# Patient Record
Sex: Female | Born: 1970 | Hispanic: No | Smoking: Never smoker
Health system: Southern US, Community
[De-identification: ages and names within clinical notes are randomized; demographics above are authoritative.]

## PROBLEM LIST (undated history)

## (undated) DIAGNOSIS — F410 Panic disorder [episodic paroxysmal anxiety] without agoraphobia: Secondary | ICD-10-CM

## (undated) HISTORY — PX: TUBAL LIGATION: SHX77

## (undated) HISTORY — PX: TONSILLECTOMY: SUR1361

---

## 2002-03-17 ENCOUNTER — Encounter: Admission: RE | Admit: 2002-03-17 | Discharge: 2002-03-17 | Payer: Self-pay | Admitting: Otolaryngology

## 2002-03-17 ENCOUNTER — Encounter: Payer: Self-pay | Admitting: Otolaryngology

## 2003-02-26 ENCOUNTER — Emergency Department (HOSPITAL_COMMUNITY): Admission: EM | Admit: 2003-02-26 | Discharge: 2003-02-26 | Payer: Self-pay | Admitting: Emergency Medicine

## 2004-02-12 ENCOUNTER — Inpatient Hospital Stay (HOSPITAL_COMMUNITY): Admission: AD | Admit: 2004-02-12 | Discharge: 2004-02-12 | Payer: Self-pay | Admitting: *Deleted

## 2008-08-03 ENCOUNTER — Emergency Department (HOSPITAL_COMMUNITY): Admission: EM | Admit: 2008-08-03 | Discharge: 2008-08-04 | Payer: Self-pay | Admitting: Emergency Medicine

## 2011-09-06 LAB — URINE MICROSCOPIC-ADD ON

## 2011-09-06 LAB — URINALYSIS, ROUTINE W REFLEX MICROSCOPIC
Bilirubin Urine: NEGATIVE
Glucose, UA: NEGATIVE
Hgb urine dipstick: NEGATIVE
Ketones, ur: NEGATIVE
Nitrite: POSITIVE — AB
Protein, ur: NEGATIVE
Specific Gravity, Urine: 1.017
Urobilinogen, UA: 1
pH: 6.5

## 2011-09-06 LAB — PREGNANCY, URINE: Preg Test, Ur: NEGATIVE

## 2011-09-06 LAB — RAPID STREP SCREEN (MED CTR MEBANE ONLY): Streptococcus, Group A Screen (Direct): NEGATIVE

## 2013-08-29 ENCOUNTER — Encounter (HOSPITAL_COMMUNITY): Payer: Self-pay

## 2013-08-29 ENCOUNTER — Emergency Department (HOSPITAL_COMMUNITY): Payer: Self-pay

## 2013-08-29 ENCOUNTER — Emergency Department (HOSPITAL_COMMUNITY)
Admission: EM | Admit: 2013-08-29 | Discharge: 2013-08-29 | Disposition: A | Discharge status: Home / Self Care | Payer: Self-pay | Attending: Emergency Medicine | Admitting: Emergency Medicine

## 2013-08-29 DIAGNOSIS — H538 Other visual disturbances: Secondary | ICD-10-CM | POA: Insufficient documentation

## 2013-08-29 DIAGNOSIS — H579 Unspecified disorder of eye and adnexa: Secondary | ICD-10-CM | POA: Insufficient documentation

## 2013-08-29 DIAGNOSIS — J069 Acute upper respiratory infection, unspecified: Secondary | ICD-10-CM | POA: Insufficient documentation

## 2013-08-29 MED ORDER — CETIRIZINE-PSEUDOEPHEDRINE ER 5-120 MG PO TB12: 1.0000 | ORAL_TABLET | Freq: Every day | ORAL | Status: AC

## 2013-08-29 MED ORDER — ALBUTEROL SULFATE HFA 108 (90 BASE) MCG/ACT IN AERS
2.0000 | INHALATION_SPRAY | RESPIRATORY_TRACT | Status: DC | PRN
  Administered 2013-08-29: 2 via RESPIRATORY_TRACT
  Filled 2013-08-29: qty 6.7

## 2013-08-29 MED ORDER — ALBUTEROL SULFATE (5 MG/ML) 0.5% IN NEBU
5.0000 mg | INHALATION_SOLUTION | Freq: Once | RESPIRATORY_TRACT | Status: AC
  Administered 2013-08-29: 5 mg via RESPIRATORY_TRACT
  Filled 2013-08-29: qty 1

## 2013-08-29 NOTE — ED Provider Notes (Signed)
CSN: [id number]     Arrival date & time 08/29/13  1324 History  This chart was scribed for non-physician practitioner [name], PA-C working with [name], MD by Danella Maiers, ED Scribe. This patient was seen in room TR10C/TR10C and the patient's care was started at 3:10 PM.    Chief Complaint  Patient presents with  . Shortness of Breath  . Cough   Patient is a 42 y.o. female presenting with shortness of breath. The history is provided by the patient. No language interpreter was used.  Shortness of Breath Associated symptoms: cough    HPI Comments: Katherine Coffey is a 42 y.o. female who presents to the Emergency Department complaining of cough and SOB that has been worsening over the past 2 weeks. She had breathing treatments done yesterday and today with some relief. She also reports unproductive cough, itching eyes, blurred vision. Denies rhinorrhea, congestion CP abdominal pain rash. She denies h/o asthma. She took OTC cough medicine but it made the dyspnea worse. She is not on BC pills. She denies recent history of surgery. She denies history of asthma, DVT,PE, cancer. She denies laying down for prolonged periods recently or long car rides. She is a non-smoker.   History reviewed. No pertinent past medical history. Past Surgical History  Procedure Laterality Date  . Tubal ligation    . Tonsillectomy     History reviewed. No pertinent family history. History  Substance Use Topics  . Smoking status: Never Smoker   . Smokeless tobacco: Not on file  . Alcohol Use: Yes   OB History   Grav Para Term Preterm Abortions TAB SAB Ect Mult Living                 Review of Systems  Respiratory: Positive for cough and shortness of breath.   All other systems reviewed and are negative.    Allergies  Review of patient's allergies indicates no known allergies.  Home Medications   Current Outpatient Rx  Name  Route  Sig  Dispense  Refill  . dextromethorphan (DELSYM) 30  MG/5ML liquid   Oral   Take 60 mg by mouth as needed for cough.         Marland Kitchen ibuprofen (ADVIL,MOTRIN) 200 MG tablet   Oral   Take 200 mg by mouth every 6 (six) hours as needed for pain.          BP 161/105  Pulse 87  Temp(Src) 98.3 F (36.8 C) (Oral)  Resp 22  SpO2 100%  LMP 08/29/2013 Physical Exam  Nursing note and vitals reviewed. Constitutional: She is oriented to person, place, and time. She appears well-developed and well-nourished. No distress.  HENT:  Head: Normocephalic and atraumatic.  Eyes: EOM are normal.  Neck: Neck supple. No tracheal deviation present.  Cardiovascular: Normal rate.   Pulmonary/Chest: Effort normal and breath sounds normal. No respiratory distress. She has no wheezes.  Musculoskeletal: Normal range of motion.  Neurological: She is alert and oriented to person, place, and time.  Skin: Skin is warm and dry.  Psychiatric: She has a normal mood and affect. Her behavior is normal.    ED Course  Procedures (including critical care time) Medications  albuterol (PROVENTIL) (5 MG/ML) 0.5% nebulizer solution 5 mg (5 mg Nebulization Given 08/29/13 1343)   DIAGNOSTIC STUDIES: Oxygen Saturation is 100% on room air, normal by my interpretation.    COORDINATION OF CARE: 3:16 PM- Discussed treatment plan with pt which includes albuterol prescription and  pt agrees to plan.  PERC neg, timi zero.    Labs Review Labs Reviewed - No data to display Imaging Review Dg Chest 2 View  08/29/2013   CLINICAL DATA:  Short of breath. Cough  EXAM: CHEST  2 VIEW  COMPARISON:  08/04/2008  FINDINGS: The heart size and mediastinal contours are within normal limits. Both lungs are clear. The visualized skeletal structures are unremarkable.  IMPRESSION: No active cardiopulmonary disease.   Electronically Signed   By: Marlan Palau M.D.   On: 08/29/2013 14:57    MDM   1. URI (upper respiratory infection)    BP 161/105  Pulse 87  Temp(Src) 98.3 F (36.8 C) (Oral)   Resp 22  SpO2 100%  LMP 08/29/2013  I have reviewed nursing notes and vital signs. I personally reviewed the imaging tests through PACS system  I reviewed available ER/hospitalization records thought the EMR   I personally performed the services described in this documentation, which was scribed in my presence. The recorded information has been reviewed and is accurate.     [name], PA-C 08/29/13 1533

## 2013-08-29 NOTE — ED Notes (Signed)
Pt states that she feels much better than she felt when she came in.  Stated she feels the breathing treatment did some good.  Lungs sounds clear bilaterally and pt is without cough at this time.

## 2013-08-29 NOTE — ED Notes (Addendum)
Pt c/o SOB, dizziness, sensitivity to light, non productive cough x3 days. Audible wheezing noted in triage. Pt denies chest pain

## 2013-08-30 NOTE — ED Provider Notes (Signed)
Medical screening examination/treatment/procedure(s) were performed by non-physician practitioner and as supervising physician I was immediately available for consultation/collaboration.   [name], MD 08/30/13 1357

## 2018-07-15 ENCOUNTER — Encounter (HOSPITAL_COMMUNITY): Payer: Self-pay | Admitting: Emergency Medicine

## 2018-07-15 ENCOUNTER — Emergency Department (HOSPITAL_COMMUNITY)
Admission: EM | Admit: 2018-07-15 | Discharge: 2018-07-15 | Disposition: A | Discharge status: Home / Self Care | Payer: Medicaid Other | Attending: Emergency Medicine | Admitting: Emergency Medicine

## 2018-07-15 ENCOUNTER — Emergency Department (HOSPITAL_COMMUNITY): Payer: Medicaid Other

## 2018-07-15 ENCOUNTER — Other Ambulatory Visit: Payer: Self-pay

## 2018-07-15 DIAGNOSIS — R0789 Other chest pain: Secondary | ICD-10-CM

## 2018-07-15 DIAGNOSIS — Z79899 Other long term (current) drug therapy: Secondary | ICD-10-CM | POA: Insufficient documentation

## 2018-07-15 DIAGNOSIS — F419 Anxiety disorder, unspecified: Secondary | ICD-10-CM

## 2018-07-15 DIAGNOSIS — K219 Gastro-esophageal reflux disease without esophagitis: Secondary | ICD-10-CM

## 2018-07-15 HISTORY — DX: Panic disorder (episodic paroxysmal anxiety): F41.0

## 2018-07-15 LAB — BASIC METABOLIC PANEL
ANION GAP: 17 — AB (ref 5–15)
BUN: 11 mg/dL (ref 6–20)
CHLORIDE: 106 mmol/L (ref 98–111)
CO2: 17 mmol/L — ABNORMAL LOW (ref 22–32)
Calcium: 9.3 mg/dL (ref 8.9–10.3)
Creatinine, Ser: 0.67 mg/dL (ref 0.44–1.00)
GFR calc non Af Amer: >60 mL/min (ref >60)
Glucose, Bld: 103 mg/dL — ABNORMAL HIGH (ref 70–99)
POTASSIUM: 3.6 mmol/L (ref 3.5–5.1)
Sodium: 140 mmol/L (ref 135–145)

## 2018-07-15 LAB — CBC
HEMATOCRIT: 40.8 % (ref 36.0–46.0)
HEMOGLOBIN: 13.1 g/dL (ref 12.0–15.0)
MCH: 26.3 pg (ref 26.0–34.0)
MCHC: 32.1 g/dL (ref 30.0–36.0)
MCV: 81.9 fL (ref 78.0–100.0)
Platelets: 283 10*3/uL (ref 150–400)
RBC: 4.98 MIL/uL (ref 3.87–5.11)
RDW: 22.1 % — ABNORMAL HIGH (ref 11.5–15.5)
WBC: 6.8 10*3/uL (ref 4.0–10.5)

## 2018-07-15 LAB — I-STAT TROPONIN, ED
TROPONIN I, POC: 0 ng/mL (ref 0.00–0.08)
Troponin i, poc: 0 ng/mL (ref 0.00–0.08)

## 2018-07-15 LAB — I-STAT BETA HCG BLOOD, ED (MC, WL, AP ONLY): I-stat hCG, quantitative: <5 m[IU]/mL (ref <5)

## 2018-07-15 LAB — TSH: TSH: 0.55 u[IU]/mL (ref 0.350–4.500)

## 2018-07-15 MED ORDER — HYDROXYZINE HCL 25 MG PO TABS: 25.0000 mg | ORAL_TABLET | Freq: Three times a day (TID) | ORAL | 0 refills | Status: DC | PRN

## 2018-07-15 MED ORDER — ASPIRIN 81 MG PO CHEW
324.0000 mg | CHEWABLE_TABLET | Freq: Once | ORAL | Status: AC
  Administered 2018-07-15: 324 mg via ORAL
  Filled 2018-07-15: qty 4

## 2018-07-15 MED ORDER — HYDROXYZINE HCL 25 MG PO TABS: 25.0000 mg | ORAL_TABLET | Freq: Three times a day (TID) | ORAL | 0 refills | Status: AC | PRN

## 2018-07-15 MED ORDER — LORAZEPAM 2 MG/ML IJ SOLN
1.0000 mg | Freq: Once | INTRAMUSCULAR | Status: AC
  Administered 2018-07-15: 1 mg via INTRAMUSCULAR
  Filled 2018-07-15: qty 1

## 2018-07-15 MED ORDER — PANTOPRAZOLE SODIUM 20 MG PO TBEC: 20.0000 mg | DELAYED_RELEASE_TABLET | Freq: Every day | ORAL | 0 refills | Status: AC

## 2018-07-15 MED ORDER — LORAZEPAM 1 MG PO TABS
1.0000 mg | ORAL_TABLET | Freq: Once | ORAL | Status: AC
  Administered 2018-07-15: 1 mg via ORAL
  Filled 2018-07-15: qty 1

## 2018-07-15 MED ORDER — ESOMEPRAZOLE MAGNESIUM 40 MG PO CPDR: 40.0000 mg | DELAYED_RELEASE_CAPSULE | Freq: Every day | ORAL | 0 refills | Status: DC

## 2018-07-15 NOTE — Discharge Instructions (Addendum)
Continue taking home medication as prescribed. Add Protonix once a day for reflux. Use hydroxyzine as needed for anxiety or panic attacks. It is important that you follow-up with your primary care doctor for further evaluation and management of your medications. Return to the emergency room with any new, worsening, or concerning symptoms.

## 2018-07-15 NOTE — ED Provider Notes (Signed)
The patient is a 47 year old female, she presents with chest tightness, she is having difficulty speaking because she is hyperventilating however she is able to tell me that she has been having this for a week, it comes and goes at rest as well as when she is exerting herself, she has never had any chest pain before, she is not a diabetic and takes no medicine for high blood pressure, high cholesterol, she is a non-smoker and does not drink any alcohol.  On exam she has a heart rate of about 90 with no murmurs, clear lung sounds, no edema.  Her EKG is abnormal but needs to be rechecked.  There is no ST elevation to suggest a STEMI and compared to her prior EKG there are similar T wave abnormalities in the inferior leads from prior studies.  We will get some lab work, chest x-ray, repeat EKG, the patient was given 2 mg of Ativan to help with her anxiety and what is apparently a panic attack.  Medical screening examination/treatment/procedure(s) were conducted as a shared visit with non-physician practitioner(s) and myself.  I personally evaluated the patient during the encounter.  Clinical Impression:   Final diagnoses:  None         Eber HongMiller, [place], MD 07/18/18 2022

## 2018-07-15 NOTE — ED Provider Notes (Signed)
[facility] EMERGENCY DEPARTMENT Provider Note   CSN: [id number] Arrival date & time: 07/15/18  1355     History   Chief Complaint Chief Complaint  Patient presents with  . Chest Pain    HPI Katherine Coffey is a 47 y.o. female presented for evaluation of chest pain.  Patient states since she has been taking the venlafaxine, which started beginning of June, she has been having worsening anxiety and associated chest pain.  She states that when she relaxes, she starts to develop left-sided chest pain.  She becomes very anxious and occasionally short of breath.  She states that this is distant with previous episodes of panic attacks.  She has been diagnosed with anxiety and depression, tried on multiple different medications for this.  She states that this morning, she had multiple panic attacks, was never able to calm herself down.  By the time she came to the ER, she was hyperventilating.  She was given Ativan in triage, since then her symptoms have resolved.  She is currently symptom-free.  She denies current chest pain, shortness of breath, nausea, vomiting, diaphoresis, cough, leg pain or swelling.  Patient states she has a history of GERD, takes medication for this.  States GERD has been worse recently.  She denies other cardiac history. She does not smoke cigarettes.  Denies alcohol or drug use.  HPI  Past Medical History:  Diagnosis Date  . Panic attack     There are no active problems to display for this patient.   Past Surgical History:  Procedure Laterality Date  . TONSILLECTOMY    . TUBAL LIGATION       OB History   None      Home Medications    Prior to Admission medications   Medication Sig Start Date End Date Taking? Authorizing Provider  calcium carbonate (TUMS - DOSED IN MG ELEMENTAL CALCIUM) 500 MG chewable tablet Chew 1-2 tablets by mouth as needed for indigestion or heartburn.  08/15/16  Yes [provider]  Cholecalciferol  (VITAMIN D3) 2000 units capsule Take 2,000 Units by mouth daily.   Yes [provider]  Dexlansoprazole (DEXILANT) 30 MG capsule Take 30 mg by mouth daily.   Yes [provider]  ferrous sulfate 325 (65 FE) MG tablet Take 325 mg by mouth 2 (two) times daily with a meal.   Yes [provider]  levothyroxine (UNITHROID) 137 MCG tablet Take 137 mcg by mouth daily before breakfast.   Yes [provider]  naproxen sodium (ALEVE) 220 MG tablet Take 220 mg by mouth 2 (two) times daily as needed (for pain or headaches).    Yes [provider]  prazosin (MINIPRESS) 1 MG capsule Take 2 mg by mouth at bedtime.   Yes [provider]  Aspirin-Salicylamide-Caffeine (BC HEADACHE POWDER PO) Take 1 packet by mouth as needed (for pain or headaches).     [provider]  cetirizine-pseudoephedrine (ZYRTEC-D) 5-120 MG per tablet Take 1 tablet by mouth daily. Patient not taking: Reported on 07/15/2018 08/29/13   [name], PA-C  hydrOXYzine (ATARAX/VISTARIL) 25 MG tablet Take 1 tablet (25 mg total) by mouth every 8 (eight) hours as needed. 07/15/18   Dreshaun Stene, PA-C  pantoprazole (PROTONIX) 20 MG tablet Take 1 tablet (20 mg total) by mouth daily. 07/15/18   Damone Fancher, PA-C  venlafaxine (EFFEXOR) 50 MG tablet Take 50 mg by mouth 2 (two) times daily.    [provider]  Family History No family history on file.  Social History Social History   Tobacco Use  . Smoking status: Never Smoker  Substance Use Topics  . Alcohol use: Yes  . Drug use: Yes    Types: Marijuana     Allergies   Venlafaxine   Review of Systems Review of Systems  Respiratory: Positive for chest tightness (Resolved) and shortness of breath (Resolved).   Cardiovascular: Positive for chest pain (Resolved).  Psychiatric/Behavioral: The patient is nervous/anxious (Resolved).   All other systems reviewed and are negative.    Physical Exam Updated  Vital Signs BP 126/60   Pulse 61   Resp (!) 22   Ht 5' 0.5" (1.537 m)   Wt 93.9 kg   SpO2 100%   BMI 39.76 kg/m   Physical Exam  Constitutional: She is oriented to person, place, and time. She appears well-developed and well-nourished. No distress.  Laying comfortably in bed in no acute distress  HENT:  Head: Normocephalic and atraumatic.  Eyes: Pupils are equal, round, and reactive to light. Conjunctivae and EOM are normal.  Neck: Normal range of motion. Neck supple.  Cardiovascular: Normal rate, regular rhythm and intact distal pulses.  Pulmonary/Chest: Effort normal and breath sounds normal. No respiratory distress. She has no wheezes.  Speaking in full sentences.  Clear lung sounds in all fields.  No signs of respiratory distress.  Abdominal: Soft. She exhibits no distension and no mass. There is no tenderness. There is no guarding.  Musculoskeletal: Normal range of motion.  No leg pain or swelling.  Pedal pulses intact bilaterally.  Good cap refill.  Neurological: She is alert and oriented to person, place, and time.  Skin: Skin is warm and dry. Capillary refill takes less than 2 seconds.  Psychiatric: She has a normal mood and affect.  Nursing note and vitals reviewed.    ED Treatments / Results  Labs (all labs ordered are listed, but only abnormal results are displayed) Labs Reviewed  BASIC METABOLIC PANEL - Abnormal; Notable for the following components:      Result Value   CO2 17 (*)    Glucose, Bld 103 (*)    Anion gap 17 (*)    All other components within normal limits  CBC - Abnormal; Notable for the following components:   RDW 22.1 (*)    All other components within normal limits  TSH  I-STAT TROPONIN, ED  I-STAT TROPONIN, ED  I-STAT BETA HCG BLOOD, ED ([facility], [facility], [facility] ONLY)  I-STAT TROPONIN, ED    EKG EKG Interpretation  Date/Time:  Monday July 15 2018 14:44:35 EDT Ventricular Rate:  65 PR Interval:  144 QRS Duration: 88 QT Interval:  392 QTC  Calculation: 407 R Axis:   31 Text Interpretation:  Normal sinus rhythm T wave abnormality, consider inferolateral ischemia Abnormal ECG compared wtih old tracing going back to 9/14, no changes Confirmed by Eber HongMiller, Brian ([id number]) on 07/15/2018 2:45:17 PM   Radiology Dg Chest 2 View  Result Date: 07/15/2018 CLINICAL DATA:  Chest pain. EXAM: CHEST - 2 VIEW COMPARISON:  08/29/2013 FINDINGS: The heart size and mediastinal contours are within normal limits. Both lungs are clear. The visualized skeletal structures are unremarkable. IMPRESSION: No active cardiopulmonary disease. Electronically Signed   By: Francene BoyersJames  Maxwell M.D.   On: 07/15/2018 15:07    Procedures Procedures (including critical care time)  Medications Ordered in ED Medications  LORazepam (ATIVAN) injection 1 mg (1 mg Intramuscular Given 07/15/18 1413)  LORazepam (ATIVAN) tablet 1  mg (1 mg Oral Given 07/15/18 1421)  aspirin chewable tablet 324 mg (324 mg Oral Given 07/15/18 1425)     Initial Impression / Assessment and Plan / ED Course  I have reviewed the triage vital signs and the nursing notes.  Pertinent labs & imaging results that were available during my care of the patient were reviewed by me and considered in my medical decision making (see chart for details).     Pt presenting for evaluation of chest pain.  Physical exam reassuring, currently symptom-free.  She is afebrile not tachycardic.  Appears nontoxic.  Patient reports chest pain is present in conjunction with anxiety episodes.  No chest pain at other times.  Symptoms have worsened since starting the venlafaxine.  Today's episode was resolved with Ativan.  EKG reassuring, unchanged from previous.  Chest x-ray viewed interpreted by me, no pneumonia, pneumothorax, or effusions.  Initial troponin negative.  Labs reassuring.  TSH normal.  Doubt ACS, hyperthyroidism, or PE.  Likely related to anxiety.  However, will obtain delta troponin for further evaluation.  Delta  troponin negative.  Discussed with patient.  Discussed possibility of worsening GERD, symptoms are worse when she lays flat and relaxes.  Will add Protonix to patient's Dilantin.  Additionally, discussed possibility of anxiety.  Patient states she has done well on Vistaril in the past.  Will prescribe a short course of Vistaril.  Stressed importance of follow-up with PCP.  Discussed option of seeing psychology/titrate for further evaluation of her symptoms.  At this time, patient present for discharge.  Return precautions given.  Patient states she understands and agrees to plan.   Final Clinical Impressions(s) / ED Diagnoses   Final diagnoses:  Atypical chest pain  Anxiety  Gastroesophageal reflux disease, esophagitis presence not specified    ED Discharge Orders         Ordered    esomeprazole (NEXIUM) 40 MG capsule  Daily,   Status:  Discontinued     07/15/18 1748    hydrOXYzine (ATARAX/VISTARIL) 25 MG tablet  Every 8 hours PRN,   Status:  Discontinued     07/15/18 1748    hydrOXYzine (ATARAX/VISTARIL) 25 MG tablet  Every 8 hours PRN     07/15/18 1749    pantoprazole (PROTONIX) 20 MG tablet  Daily     07/15/18 1749           Alveria ApleyCaccavale, Catalia Massett, PA-C 07/15/18 2239    Loren RacerYelverton, David, MD 07/18/18 1534

## 2018-07-15 NOTE — ED Provider Notes (Addendum)
MSE was initiated and I personally evaluated the patient and placed orders (if any) at  2:22 PM on July 15, 2018.  The patient appears stable so that the remainder of the MSE may be completed by another provider.  Patient placed in Quick Look pathway, seen and evaluated   Chief Complaint: Shortness of breath, chest pain  HPI:   On initial presentation, patient nonverbal due to tremoring sensations, reporting to triage RN that she has panic attacks.  After IM Ativan, patient able to report that she has had intermittent substernal chest pain for 1 week.  Patient reports that the episodes come for approximately 30 minutes timing coming on exertion.  Patient reports that she had difficulty breathing on presentation the emergency department sensation of panic at present.  Patient denies any smoking history, cardiac history, history of hypertension.  ROS: See HPI (one)  Physical Exam:   Gen: No distress  Neuro: Awake and Alert  Skin: Warm    Focused Exam: B/l 2+ pulses and equal upper and lower extremities.  Tachycardia, but heart rhythm is regular rate and rhythm.  Lungs clear to auscultation.  Initial EKG with significant artifact, but is showing ischemic changes.  Will reattempt EKG after Ativan due to patient movement.  Tremoring resolved after Ativan. Do not suspect this was seizure. ASA administered in triage.   Initiation of care has begun. The patient has been counseled on the process, plan, and necessity for staying for the completion/evaluation, and the remainder of the medical screening examination    Delia ChimesMurray, Nikholas Geffre B, PA-C 07/15/18 1430    Delia ChimesMurray, Kariann Wecker B, PA-C 07/15/18 1432    Eber HongMiller, Brian, MD 07/18/18 2022

## 2018-07-15 NOTE — ED Triage Notes (Signed)
Patient hyperventilating in room, speaking only one or two words at a time and then hyperventilating again. Patient states she is having chest pain, a panic attack, and her face has become numb.

## 2019-04-08 IMAGING — DX DG CHEST 2V
2 series · 2 of 2 positions shown · non-contrast
Comparison: 08/29/2013

CLINICAL DATA: Chest pain.

EXAM:
CHEST - 2 VIEW

[chest pa]
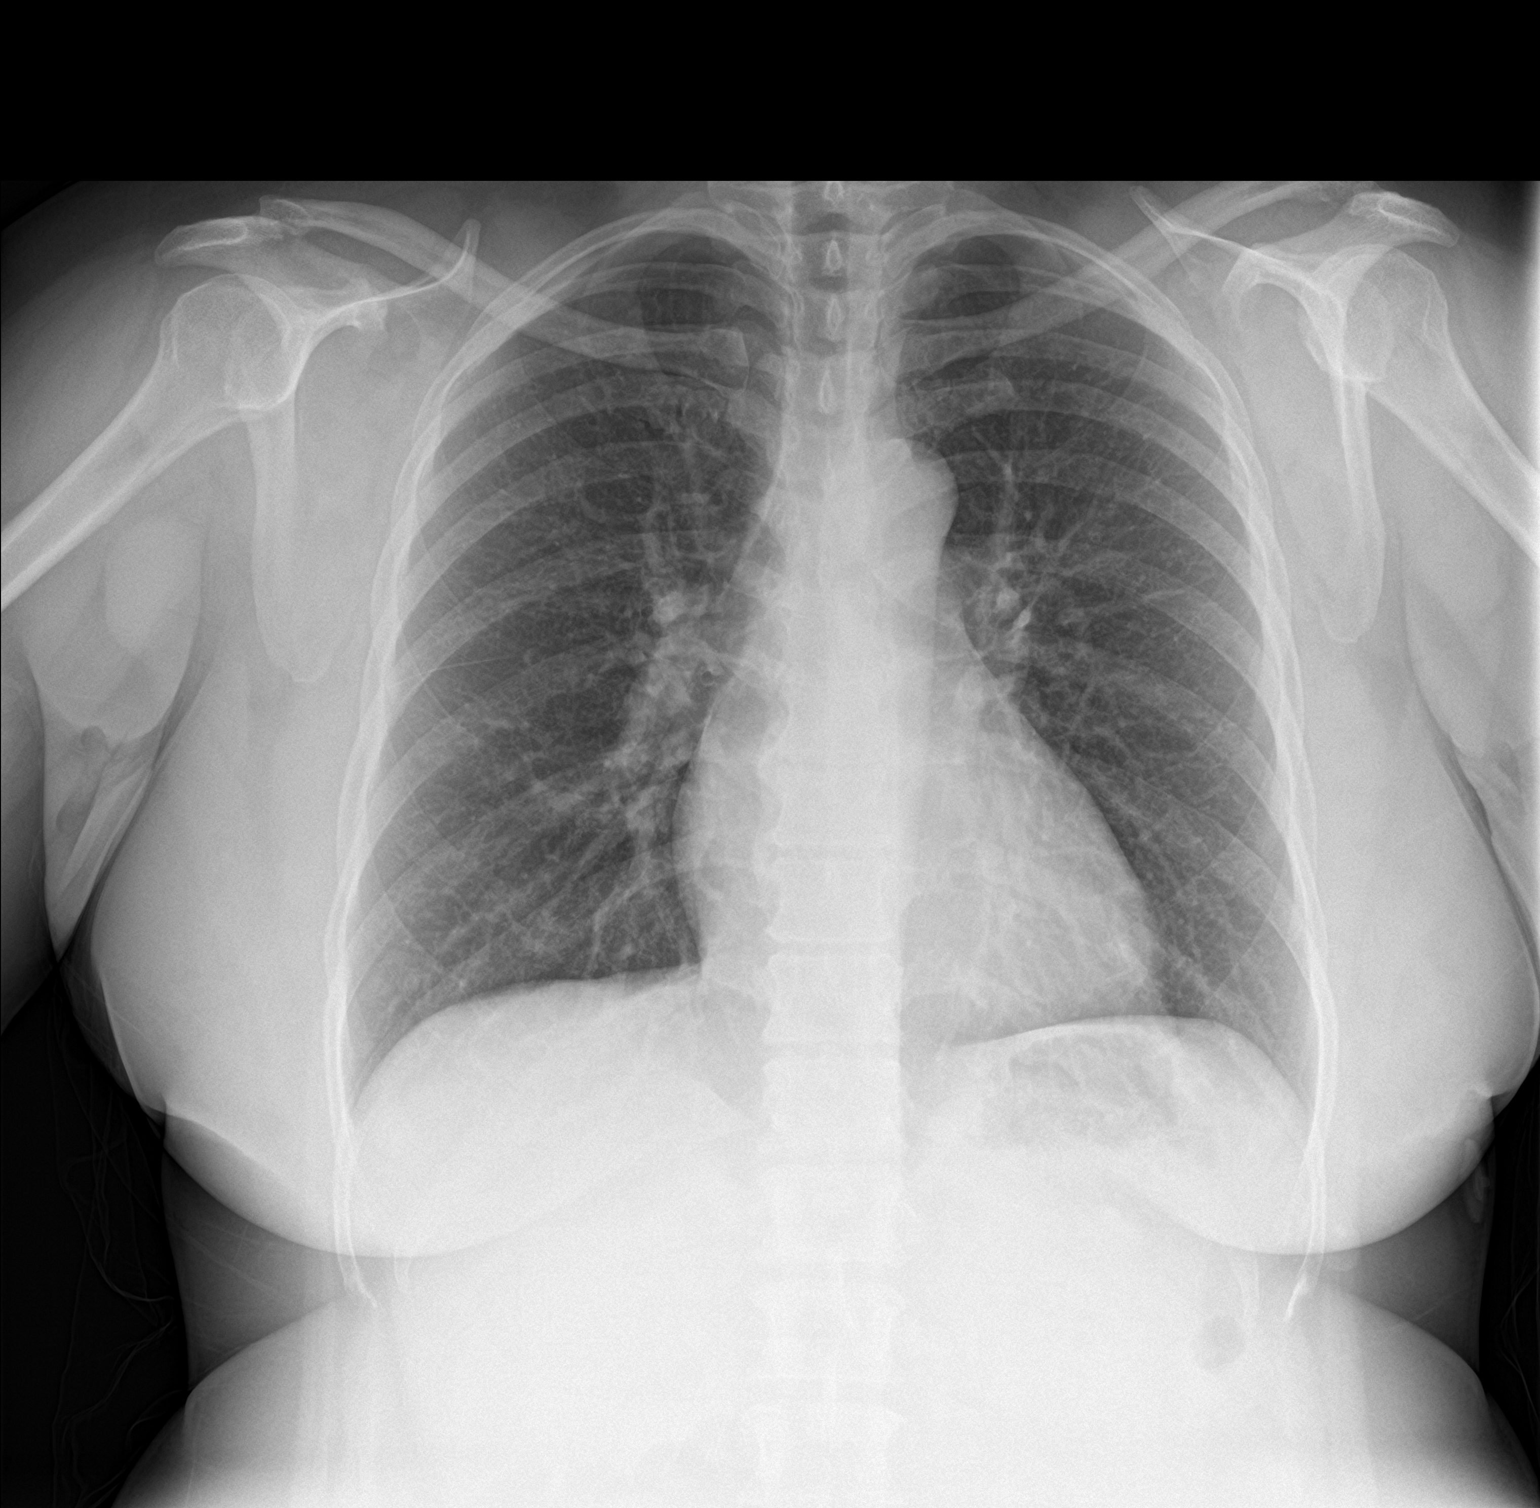

[chest lat]
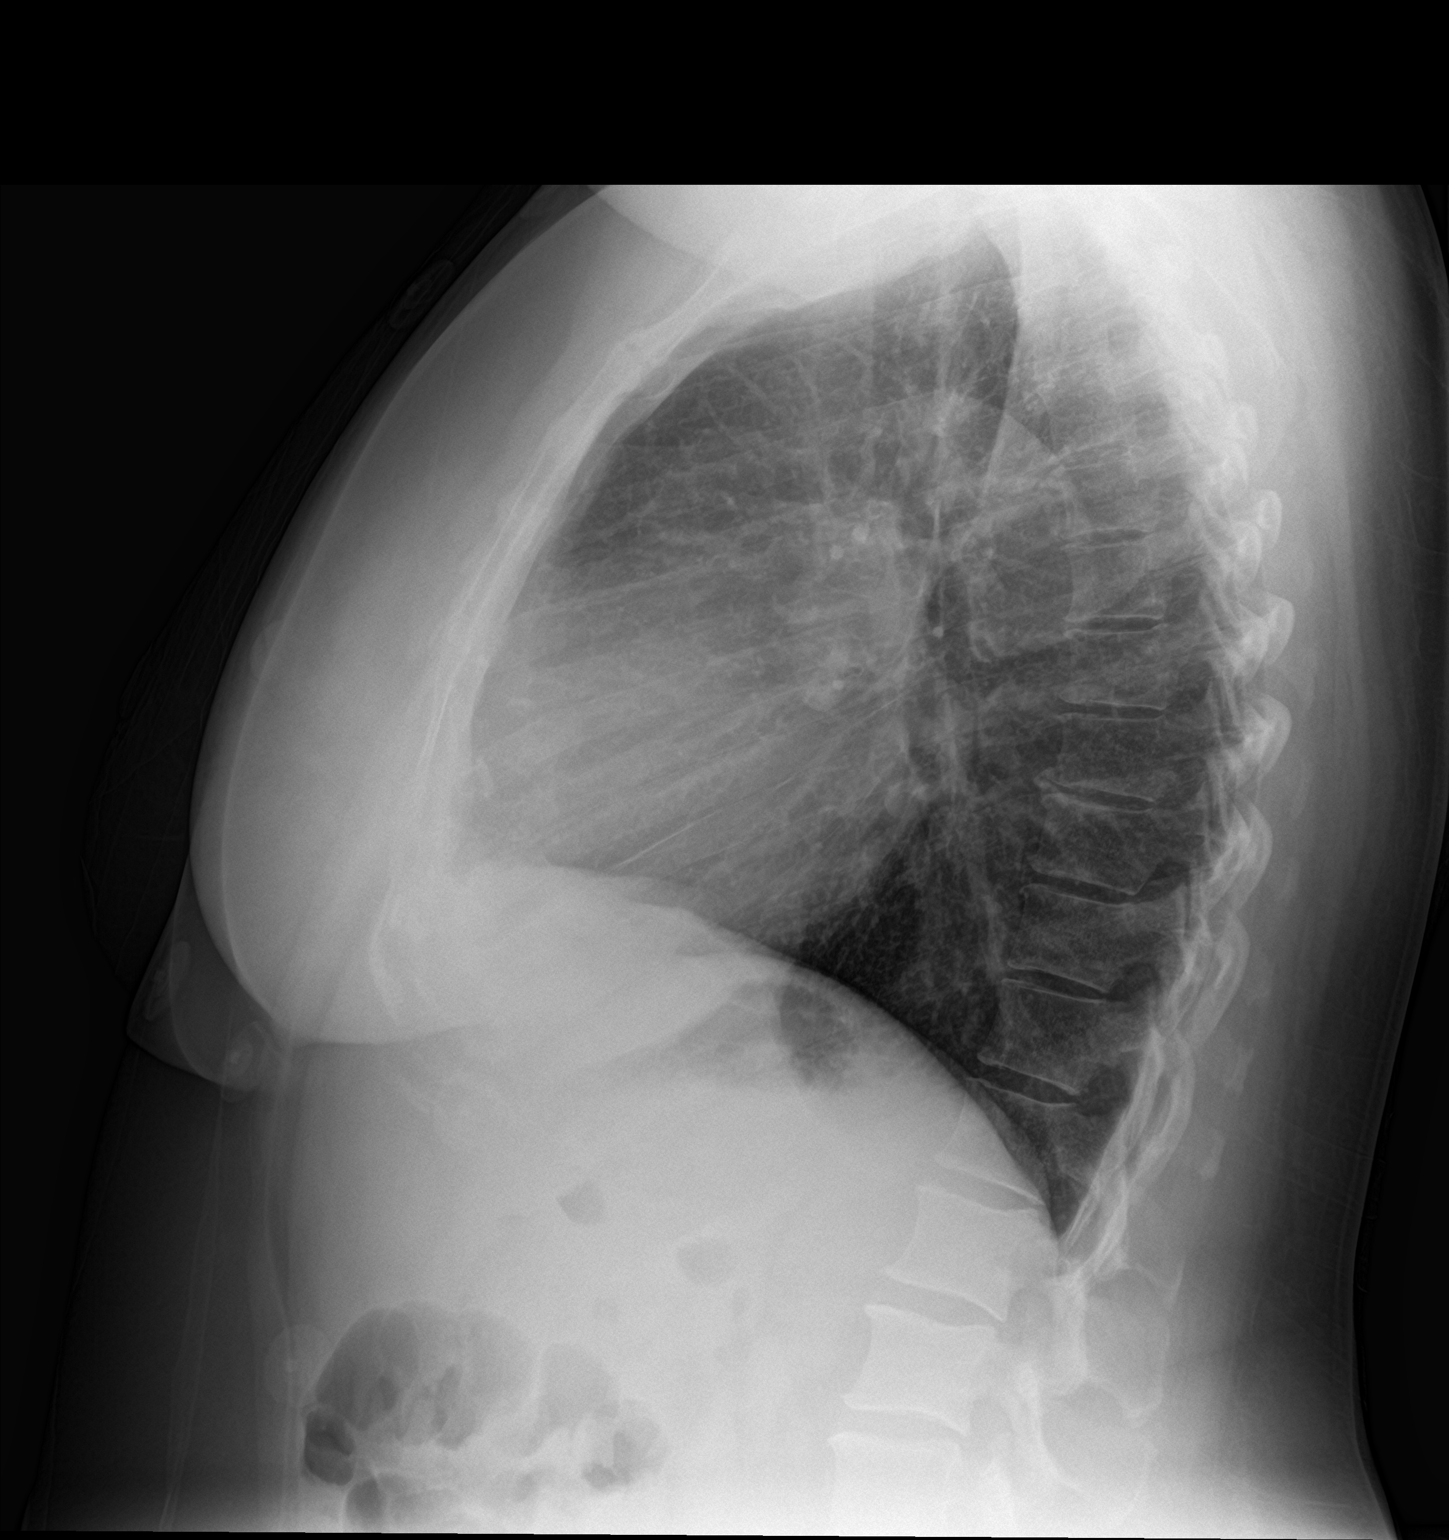

[2 of 2 positions shown; findings below may reference images not displayed]

FINDINGS: The heart size and mediastinal contours are within normal limits.
Both lungs are clear. The visualized skeletal structures are
unremarkable.
IMPRESSION: No active cardiopulmonary disease.
